# Patient Record
Sex: Male | Born: 1985 | Race: Black or African American | Hispanic: No | Marital: Single
Health system: Southern US, Community
[De-identification: ages and names within clinical notes are randomized; demographics above are authoritative.]

---

## 2019-09-12 DEATH — deceased

## 2020-10-04 ENCOUNTER — Other Ambulatory Visit: Payer: Self-pay

## 2020-10-04 ENCOUNTER — Encounter: Payer: Self-pay | Admitting: Emergency Medicine

## 2020-10-04 ENCOUNTER — Emergency Department

## 2020-10-04 ENCOUNTER — Emergency Department
Admission: EM | Admit: 2020-10-04 | Discharge: 2020-10-05 | Disposition: A | Discharge status: Other Acute Inpatient Hospital | Attending: Emergency Medicine | Admitting: Emergency Medicine

## 2020-10-04 DIAGNOSIS — S02601A Fracture of unspecified part of body of right mandible, initial encounter for closed fracture: Secondary | ICD-10-CM | POA: Diagnosis not present

## 2020-10-04 DIAGNOSIS — S02602A Fracture of unspecified part of body of left mandible, initial encounter for closed fracture: Secondary | ICD-10-CM | POA: Diagnosis not present

## 2020-10-04 DIAGNOSIS — S02609A Fracture of mandible, unspecified, initial encounter for closed fracture: Secondary | ICD-10-CM

## 2020-10-04 DIAGNOSIS — Z20822 Contact with and (suspected) exposure to covid-19: Secondary | ICD-10-CM | POA: Insufficient documentation

## 2020-10-04 DIAGNOSIS — S0993XA Unspecified injury of face, initial encounter: Secondary | ICD-10-CM | POA: Diagnosis present

## 2020-10-04 MED ORDER — MORPHINE SULFATE (PF) 4 MG/ML IV SOLN
INTRAVENOUS | Status: AC
  Administered 2020-10-04: 4 mg via INTRAVENOUS
  Filled 2020-10-04: qty 1

## 2020-10-04 MED ORDER — CLINDAMYCIN PHOSPHATE 600 MG/50ML IV SOLN
600.0000 mg | Freq: Once | INTRAVENOUS | Status: AC
  Administered 2020-10-05: 600 mg via INTRAVENOUS
  Filled 2020-10-04: qty 50

## 2020-10-04 MED ORDER — MORPHINE SULFATE (PF) 4 MG/ML IV SOLN
4.0000 mg | Freq: Once | INTRAVENOUS | Status: AC
Start: 2020-10-05 — End: 2020-10-04

## 2020-10-04 MED ORDER — ONDANSETRON HCL 4 MG/2ML IJ SOLN
INTRAMUSCULAR | Status: AC
  Administered 2020-10-04: 4 mg via INTRAVENOUS
  Filled 2020-10-04: qty 2

## 2020-10-04 MED ORDER — ONDANSETRON HCL 4 MG/2ML IJ SOLN: 4.0000 mg | Freq: Once | INTRAMUSCULAR | Status: AC
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
## 2020-10-05 LAB — BASIC METABOLIC PANEL
Anion gap: 10 (ref 5–15)
BUN: 14 mg/dL (ref 6–20)
CO2: 24 mmol/L (ref 22–32)
Calcium: 9 mg/dL (ref 8.9–10.3)
Chloride: 103 mmol/L (ref 98–111)
Creatinine, Ser: 1.07 mg/dL (ref 0.61–1.24)
GFR, Estimated: >60 mL/min (ref >60)
Glucose, Bld: 123 mg/dL — ABNORMAL HIGH (ref 70–99)
Potassium: 3.1 mmol/L — ABNORMAL LOW (ref 3.5–5.1)
Sodium: 137 mmol/L (ref 135–145)

## 2020-10-05 LAB — CBC WITH DIFFERENTIAL/PLATELET
Abs Immature Granulocytes: 0.02 10*3/uL (ref 0.00–0.07)
Basophils Absolute: 0 10*3/uL (ref 0.0–0.1)
Basophils Relative: 0 %
Eosinophils Absolute: 0 10*3/uL (ref 0.0–0.5)
Eosinophils Relative: 0 %
HCT: 42.3 % (ref 39.0–52.0)
Hemoglobin: 14.8 g/dL (ref 13.0–17.0)
Immature Granulocytes: 0 %
Lymphocytes Relative: 18 %
Lymphs Abs: 1.4 10*3/uL (ref 0.7–4.0)
MCH: 31 pg (ref 26.0–34.0)
MCHC: 35 g/dL (ref 30.0–36.0)
MCV: 88.7 fL (ref 80.0–100.0)
Monocytes Absolute: 0.6 10*3/uL (ref 0.1–1.0)
Monocytes Relative: 7 %
Neutro Abs: 5.6 10*3/uL (ref 1.7–7.7)
Neutrophils Relative %: 75 %
Platelets: 206 10*3/uL (ref 150–400)
RBC: 4.77 MIL/uL (ref 4.22–5.81)
RDW: 11.9 % (ref 11.5–15.5)
WBC: 7.7 10*3/uL (ref 4.0–10.5)
nRBC: 0 % (ref 0.0–0.2)

## 2020-10-05 LAB — RESP PANEL BY RT-PCR (FLU A&B, COVID) ARPGX2
Influenza A by PCR: NEGATIVE
Influenza B by PCR: NEGATIVE
SARS Coronavirus 2 by RT PCR: NEGATIVE

## 2020-10-05 MED ORDER — HYDROMORPHONE HCL 1 MG/ML IJ SOLN
0.5000 mg | Freq: Once | INTRAMUSCULAR | Status: AC
  Administered 2020-10-05: 0.5 mg via INTRAVENOUS
  Filled 2020-10-05: qty 1

## 2020-10-05 MED ORDER — KETOROLAC TROMETHAMINE 30 MG/ML IJ SOLN
15.0000 mg | Freq: Once | INTRAMUSCULAR | Status: AC
  Administered 2020-10-05: 15 mg via INTRAVENOUS
  Filled 2020-10-05: qty 1

## 2020-10-05 MED ORDER — HYDROMORPHONE HCL 1 MG/ML IJ SOLN
1.0000 mg | Freq: Once | INTRAMUSCULAR | Status: AC
  Administered 2020-10-05: 1 mg via INTRAVENOUS
  Filled 2020-10-05: qty 1

## 2020-10-05 MED ORDER — MORPHINE SULFATE (PF) 4 MG/ML IV SOLN
4.0000 mg | Freq: Once | INTRAVENOUS | Status: AC
  Administered 2020-10-05: 4 mg via INTRAVENOUS
  Filled 2020-10-05: qty 1

## 2020-10-05 NOTE — ED Notes (Signed)
EMTALA reviewed by this RN.  
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
## 2020-10-05 NOTE — ED Notes (Signed)
[facility] [id number], transport on the way to [facility]
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
## 2020-10-05 NOTE — ED Notes (Signed)
Informed that pt is now having pain in his throat. Dr. Larinda Buttery notified. No new orders received.

## 2020-10-05 NOTE — ED Notes (Signed)
Pt reports no improvement in pain after dilaudid. At this time MD is not comfortable prescribing additional narcotics. Pt was informed by MD prior to dilaudid administration would need to wait "a few hours"after dilaudid for more narcotics.

## 2020-10-05 NOTE — ED Notes (Signed)
PO fluids provided. 

## 2020-10-05 NOTE — ED Notes (Signed)
Report to ariel, rn.  

## 2020-10-05 NOTE — ED Notes (Signed)
Pt requesting additional pain medication MD notified.

## 2020-10-05 NOTE — ED Notes (Signed)
Pt now sleeping

## 2020-10-05 NOTE — ED Notes (Signed)
Pt placed on oxygen at 2lpm via [place] for pox while sleeping in 70s.

## 2020-10-05 NOTE — ED Notes (Signed)
Patient was accept at [facility] ED to ED Accepting Dr. May call report to [phone]. [facility] will provide transportation after 7 AM today.
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
## 2020-10-05 NOTE — ED Notes (Signed)
Report given to [facility] 

## 2020-10-05 NOTE — ED Notes (Signed)
Pt up to toilet in room.
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
